# Patient Record
Sex: Male | Born: 1975 | Race: White | Hispanic: No | Marital: Married | Smoking: Never smoker
Health system: Southern US, Community
[De-identification: ages and names within clinical notes are randomized; demographics above are authoritative.]

---

## 2015-07-14 ENCOUNTER — Encounter (HOSPITAL_COMMUNITY): Payer: Self-pay | Admitting: Emergency Medicine

## 2015-07-14 ENCOUNTER — Emergency Department (HOSPITAL_COMMUNITY)
Admission: EM | Admit: 2015-07-14 | Discharge: 2015-07-14 | Disposition: A | Discharge status: Home / Self Care | Payer: Self-pay | Attending: Emergency Medicine | Admitting: Emergency Medicine

## 2015-07-14 ENCOUNTER — Emergency Department (HOSPITAL_COMMUNITY): Payer: Self-pay

## 2015-07-14 DIAGNOSIS — E1165 Type 2 diabetes mellitus with hyperglycemia: Secondary | ICD-10-CM | POA: Insufficient documentation

## 2015-07-14 DIAGNOSIS — Y9389 Activity, other specified: Secondary | ICD-10-CM | POA: Insufficient documentation

## 2015-07-14 DIAGNOSIS — R739 Hyperglycemia, unspecified: Secondary | ICD-10-CM

## 2015-07-14 DIAGNOSIS — Y92524 Gas station as the place of occurrence of the external cause: Secondary | ICD-10-CM | POA: Insufficient documentation

## 2015-07-14 DIAGNOSIS — W1839XA Other fall on same level, initial encounter: Secondary | ICD-10-CM | POA: Insufficient documentation

## 2015-07-14 DIAGNOSIS — Y998 Other external cause status: Secondary | ICD-10-CM | POA: Insufficient documentation

## 2015-07-14 DIAGNOSIS — R55 Syncope and collapse: Secondary | ICD-10-CM

## 2015-07-14 DIAGNOSIS — S79912A Unspecified injury of left hip, initial encounter: Secondary | ICD-10-CM | POA: Insufficient documentation

## 2015-07-14 LAB — BASIC METABOLIC PANEL
ANION GAP: 5 (ref 5–15)
BUN: 9 mg/dL (ref 6–20)
CO2: 23 mmol/L (ref 22–32)
Calcium: 8.6 mg/dL — ABNORMAL LOW (ref 8.9–10.3)
Chloride: 103 mmol/L (ref 101–111)
Creatinine, Ser: 0.74 mg/dL (ref 0.61–1.24)
GFR calc Af Amer: >60 mL/min (ref >60)
Glucose, Bld: 258 mg/dL — ABNORMAL HIGH (ref 65–99)
POTASSIUM: 3.6 mmol/L (ref 3.5–5.1)
SODIUM: 131 mmol/L — AB (ref 135–145)

## 2015-07-14 LAB — URINE MICROSCOPIC-ADD ON: Bacteria, UA: NONE SEEN

## 2015-07-14 LAB — URINALYSIS, ROUTINE W REFLEX MICROSCOPIC
BILIRUBIN URINE: NEGATIVE
HGB URINE DIPSTICK: NEGATIVE
KETONES UR: NEGATIVE mg/dL
Leukocytes, UA: NEGATIVE
NITRITE: NEGATIVE
PH: 7 (ref 5.0–8.0)
Protein, ur: NEGATIVE mg/dL
SPECIFIC GRAVITY, URINE: 1.016 (ref 1.005–1.030)

## 2015-07-14 LAB — CBC
HEMATOCRIT: 43.2 % (ref 39.0–52.0)
Hemoglobin: 14 g/dL (ref 13.0–17.0)
MCH: 29.9 pg (ref 26.0–34.0)
MCHC: 32.4 g/dL (ref 30.0–36.0)
MCV: 92.3 fL (ref 78.0–100.0)
Platelets: 164 10*3/uL (ref 150–400)
RBC: 4.68 MIL/uL (ref 4.22–5.81)
RDW: 13 % (ref 11.5–15.5)
WBC: 7.7 10*3/uL (ref 4.0–10.5)

## 2015-07-14 LAB — CBG MONITORING, ED: GLUCOSE-CAPILLARY: 260 mg/dL — AB (ref 65–99)

## 2015-07-14 MED ORDER — IBUPROFEN 600 MG PO TABS
600.0000 mg | ORAL_TABLET | Freq: Four times a day (QID) | ORAL | Status: AC | PRN
Start: 2015-07-14 — End: ?

## 2015-07-14 MED ORDER — SODIUM CHLORIDE 0.9 % IV BOLUS (SEPSIS)
1000.0000 mL | Freq: Once | INTRAVENOUS | Status: AC
  Administered 2015-07-14: 1000 mL via INTRAVENOUS

## 2015-07-14 MED ORDER — METHOCARBAMOL 500 MG PO TABS: 500.0000 mg | ORAL_TABLET | Freq: Two times a day (BID) | ORAL | Status: AC

## 2015-07-14 NOTE — ED Notes (Addendum)
Per EMS-patient states he fell on left hip because he got dizzy-CGB 339-states same thing happened last week-states he does not take anything for his DM-has not taken any meds in 10 years-100 mcg of Fentanyl given in route

## 2015-07-14 NOTE — ED Provider Notes (Signed)
CSN: [id number]     Arrival date & time 07/14/15  1548 History   First MD Initiated Contact with Patient 07/14/15 1808     Chief Complaint  Patient presents with  . Hyperglycemia   HPI Travis Berg is a 40 y.o. male PMH significant for diabetes (uncontrolled) presenting with 2 episodes of syncope over the last 2 weeks. His most recent syncopal episode was today. He states that he was at a gas station bathroom and after he urinated he began to feel lightheaded and he fell onto his left hip. He states episode lasted approximately 3-4 seconds. He denies fevers, chills, headache,chest pain, shortness of breath, abdominal pain, nausea, vomiting, loss of bowel or bladder control,other precipitating events.  History reviewed. No pertinent past medical history. History reviewed. No pertinent past surgical history. No family history on file. Social History  Substance Use Topics  . Smoking status: Never Smoker   . Smokeless tobacco: None  . Alcohol Use: No    Review of Systems  Ten systems are reviewed and are negative for acute change except as noted in the HPI  Allergies  Review of patient's allergies indicates no known allergies.  Home Medications   Prior to Admission medications   Medication Sig Start Date End Date Taking? Authorizing Provider  Aspirin-Salicylamide-Caffeine (BC HEADACHE POWDER PO) Take 1 packet by mouth daily as needed (headache).   Yes Historical Provider, MD   BP 113/69 mmHg  Pulse 83  Temp(Src) 98.1 F (36.7 C) (Oral)  Resp 20  SpO2 94% Physical Exam  Constitutional: He is oriented to person, place, and time. He appears well-developed and well-nourished. No distress.  HENT:  Head: Normocephalic and atraumatic.  Mouth/Throat: Oropharynx is clear and moist. No oropharyngeal exudate.  Eyes: Conjunctivae are normal. Pupils are equal, round, and reactive to light. Right eye exhibits no discharge. Left eye exhibits no discharge. No scleral icterus.  Neck: No  tracheal deviation present.  Cardiovascular: Normal rate, regular rhythm, normal heart sounds and intact distal pulses.  Exam reveals no gallop and no friction rub.   No murmur heard. Pulmonary/Chest: Effort normal and breath sounds normal. No respiratory distress. He has no wheezes. He has no rales. He exhibits no tenderness.  Abdominal: Soft. Bowel sounds are normal. He exhibits no distension and no mass. There is no tenderness. There is no rebound and no guarding.  Musculoskeletal: He exhibits tenderness. He exhibits no edema.  Decreased range of motion at left leg and left hip.  Lymphadenopathy:    He has no cervical adenopathy.  Neurological: He is alert and oriented to person, place, and time. No cranial nerve deficit. Coordination normal.  Skin: Skin is warm and dry. No rash noted. He is not diaphoretic. No erythema.  Psychiatric: He has a normal mood and affect. His behavior is normal.  Nursing note and vitals reviewed.   ED Course  Procedures  Labs Review Labs Reviewed  BASIC METABOLIC PANEL - Abnormal; Notable for the following:    Sodium 131 (*)    Glucose, Bld 258 (*)    Calcium 8.6 (*)    All other components within normal limits  CBG MONITORING, ED - Abnormal; Notable for the following:    Glucose-Capillary 260 (*)    All other components within normal limits  CBC  URINALYSIS, ROUTINE W REFLEX MICROSCOPIC (NOT AT [facility])  CBG MONITORING, ED   Imaging Review Ct Head Wo Contrast  07/14/2015  CLINICAL DATA:  Dizziness with fall, recurrent falls. EXAM: CT HEAD  WITHOUT CONTRAST TECHNIQUE: Contiguous axial images were obtained from the base of the skull through the vertex without intravenous contrast. COMPARISON:  None. FINDINGS: Brain: Ventricles are normal in size and configuration. All areas of the brain demonstrate normal gray-white matter attenuation. There is no mass, hemorrhage, edema or other evidence of acute parenchymal abnormality. No extra-axial hemorrhage.  Vascular: No hyperdense vessel or unexpected calcification. Skull: Negative for fracture or focal lesion. Sinuses/Orbits: No acute findings. Other: None. IMPRESSION: Normal noncontrast head CT. Electronically Signed   By: Bary RichardStan  Maynard M.D.   On: 07/14/2015 18:50   Dg Hip Unilat With Pelvis 2-3 Views Left  07/14/2015  CLINICAL DATA:  Status post fall on left hip, with left hip pain. Initial encounter. EXAM: DG HIP (WITH OR WITHOUT PELVIS) 2-3V LEFT COMPARISON:  None. FINDINGS: There is no evidence of fracture or dislocation. 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 Both femoral heads are seated normally within their respective acetabula. The proximal left femur appears intact. No significant degenerative change is appreciated. The sacroiliac joints are unremarkable in appearance. The visualized bowel gas pattern is grossly unremarkable in appearance. IMPRESSION: No evidence of fracture or dislocation. 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 Electronically Signed   By: Roanna RaiderJeffery  Chang M.D.   On: 07/14/2015 19:15   I have personally reviewed and evaluated these images and lab results as part of my medical decision-making.   EKG Interpretation   Date/Time:  Wednesday July 14 2015 16:10:23 EDT Ventricular Rate:  76 PR Interval:  139 QRS Duration: 95 QT Interval:  373 QTC Calculation: 419 R Axis:   86 Text Interpretation:  Sinus rhythm No old tracing to compare Confirmed by  [facility] Center [email or web address]  MD, ELLIOTT [phone]([id number]) on 07/14/2015 4:24:26 PM      MDM   Final diagnoses:  Hyperglycemia  Syncope, unspecified syncope type   Patient nontoxic-appearing, vital signs stable. Normal neuro exam. Will image the left hip and CT head. EKG unremarkable. BMP demonstrates hyperglycemia of 258. CBC unremarkable. Head CT and hip and pelvis x-ray unremarkable.  Urinalysis demonstrates >1000 glucosuria, negative ketones. Discussed case with Dr. Criss AlvineGoldston- Dizziness  most likely result of dehydration. Ordered 2 L of IV fluid. Patient may be safely discharged home. Discussed reasons for return. Patient to follow-up with primary care provider within one week. Patient in understanding and agreement with the plan.   Melton KrebsSamantha Nicole Kynzleigh Bandel, PA-C 07/14/15 2003  Pricilla LovelessScott Goldston, MD 07/18/15 [phone]

## 2015-07-14 NOTE — ED Notes (Signed)
Bed: WA02 Expected date:  Expected time:  Means of arrival:  Comments: Hold for triage 9

## 2015-07-14 NOTE — Discharge Instructions (Signed)
Travis Berg,  Nice meeting you! Please follow-up with your primary care provider. Return to the emergency department if you develop chest pain, shortness of breath, dizziness, headache, abdominal pain, nausea, vomiting, lose consciousness again. Stay hydrated with water. Feel better soon!  S. Lane HackerNicole Fifi Schindler, PA-C

## 2017-08-15 IMAGING — CT CT HEAD W/O CM
2 series · 17 of 30 positions shown, 20 images · non-contrast
Comparison: None.

CLINICAL DATA: Dizziness with fall, recurrent falls.

EXAM:
CT HEAD WITHOUT CONTRAST
TECHNIQUE: Contiguous axial images were obtained from the base of the skull
through the vertex without intravenous contrast.

[Series 2: head w/o · axial · non-contrast · 0.45mm/px · z∈[-68,+62]mm · 9 of 34 slices shown, 12 images]
[im 4/34  brain]
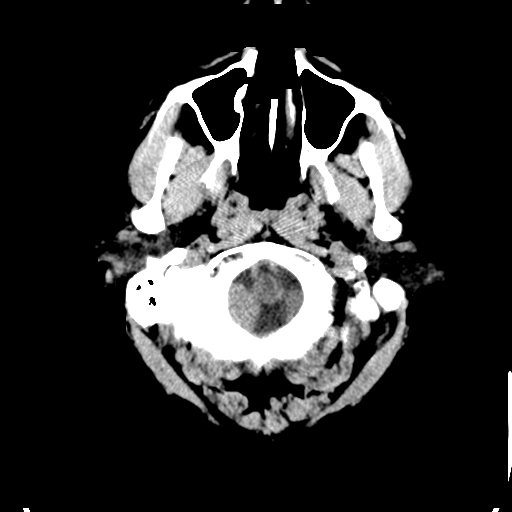
[im 4/34  bone]
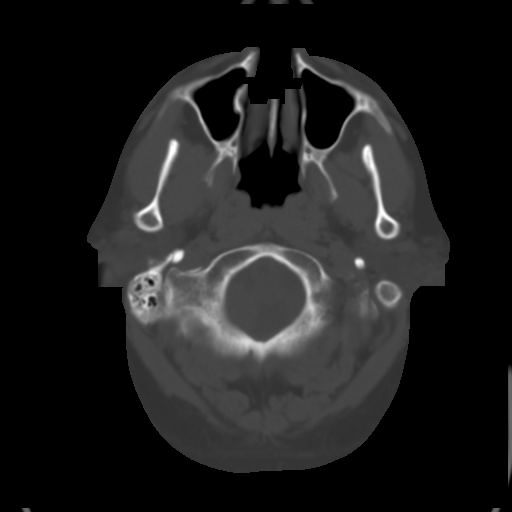
[im 7/34  brain]
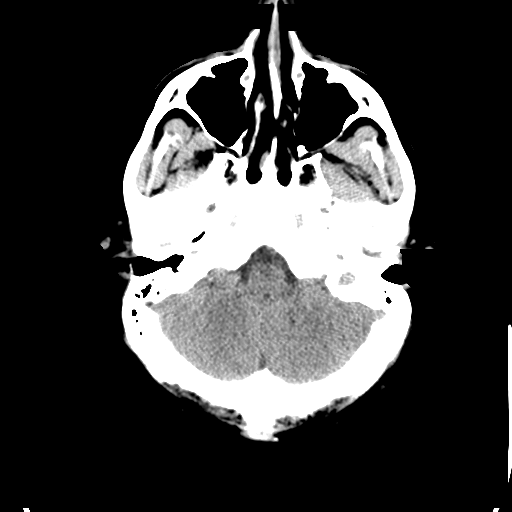
[im 10/34  brain]
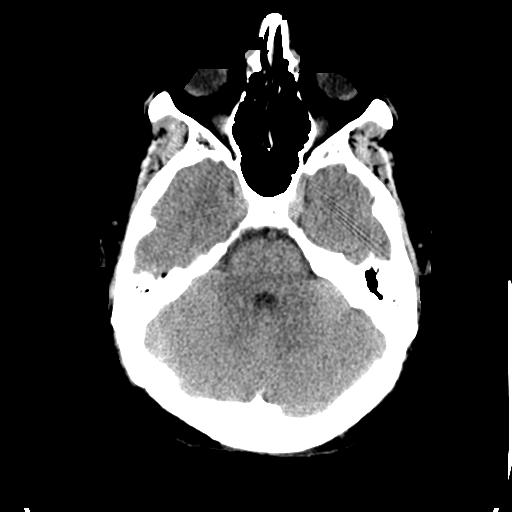
[im 14/34  brain]
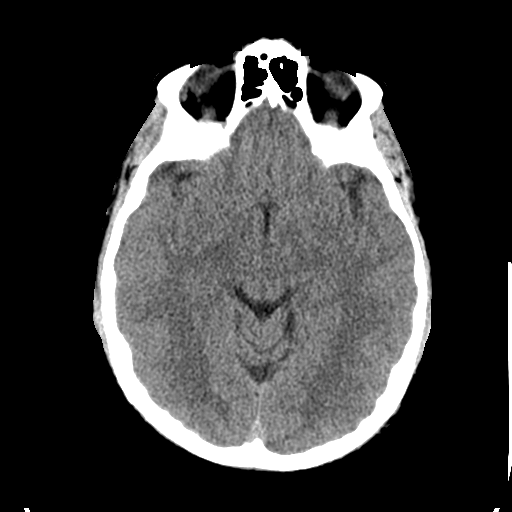
[im 17/34  brain]
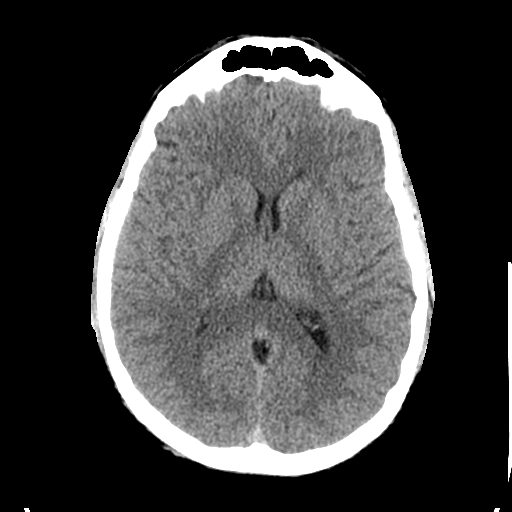
[im 17/34  bone]
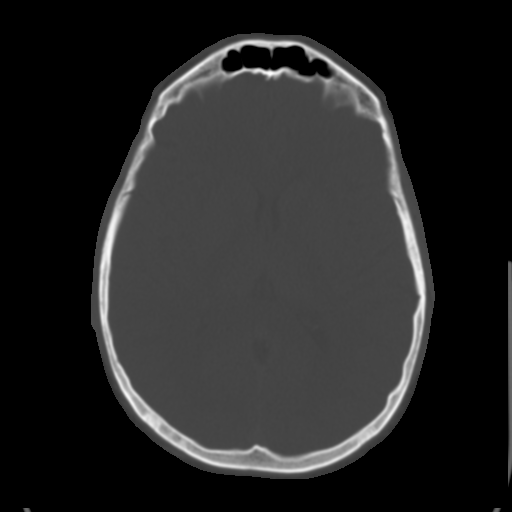
[im 20/34  brain]
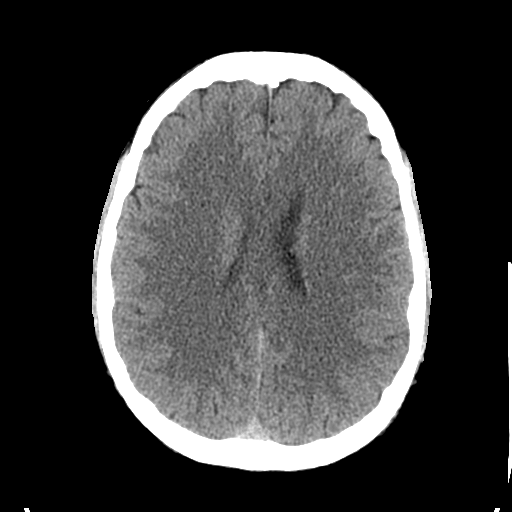
[im 24/34  brain]
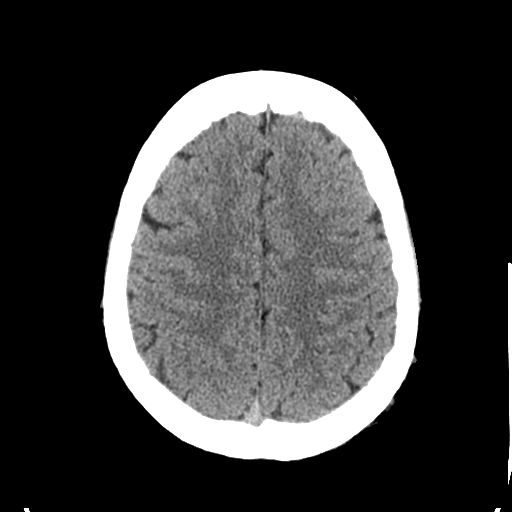
[im 27/34  brain]
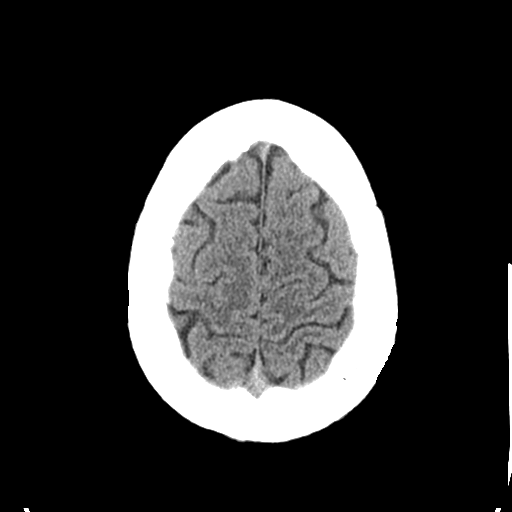
[im 30/34  brain]
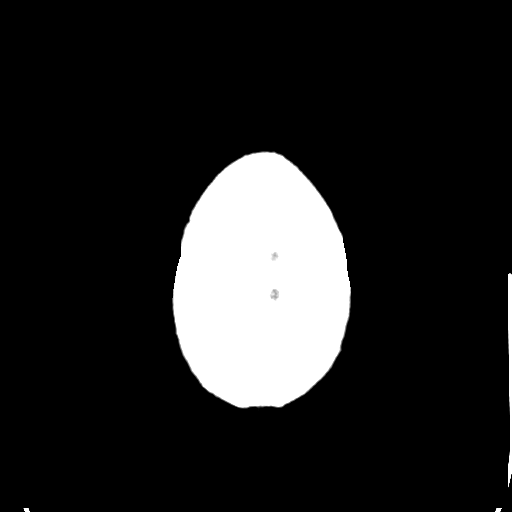
[im 30/34  bone]
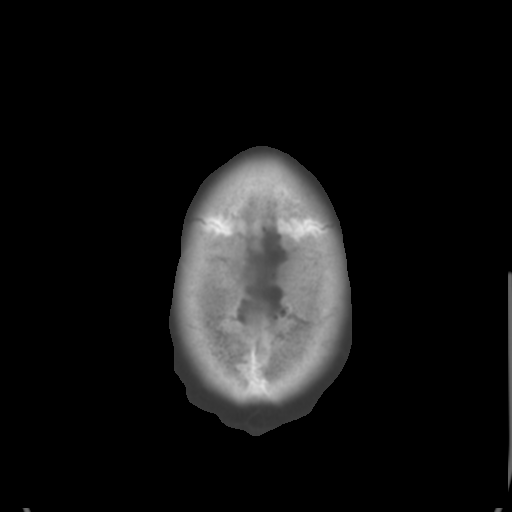

[Series 3: bone windows · axial · 0.45mm/px · z∈[-65,+61]mm · 8 of 56 slices shown]
[im 7/56  bone]
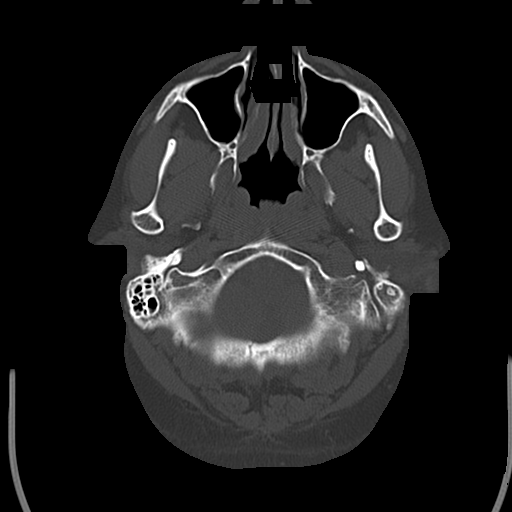
[im 13/56  bone]
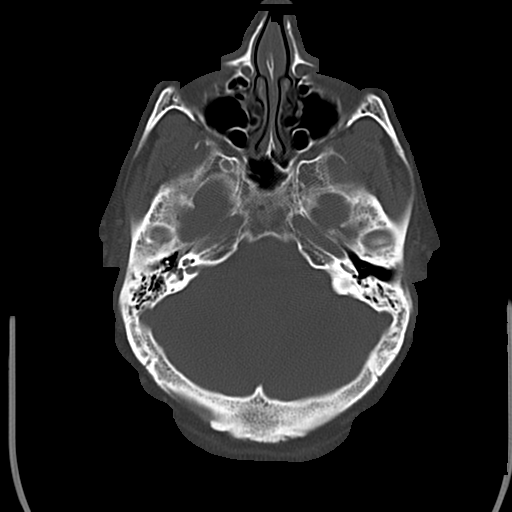
[im 19/56  bone]
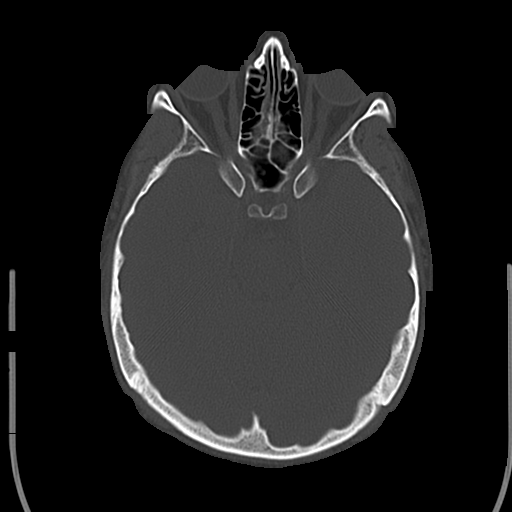
[im 25/56  bone]
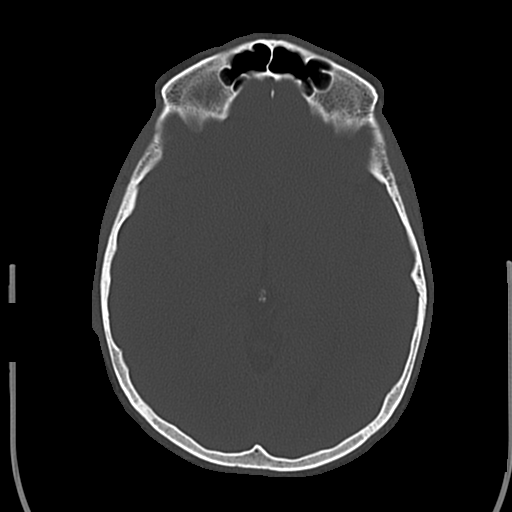
[im 31/56  bone]
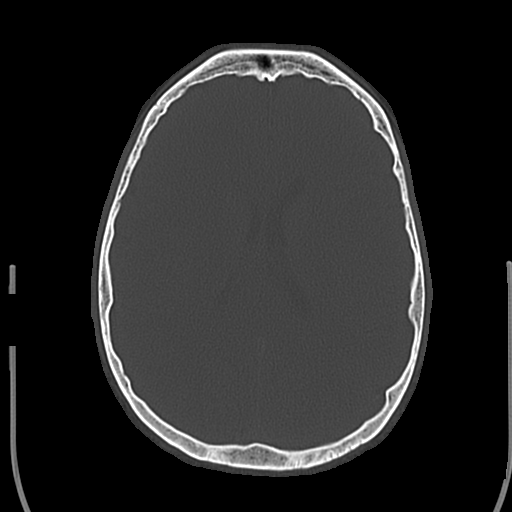
[im 37/56  bone]
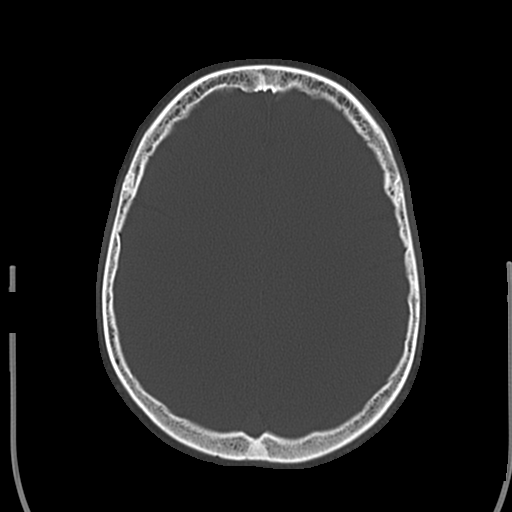
[im 43/56  bone]
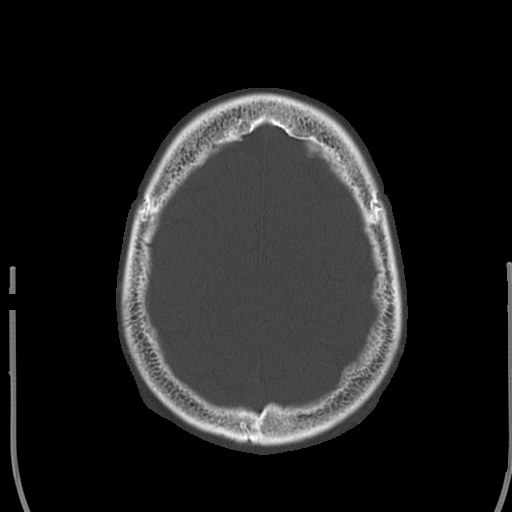
[im 49/56  bone]
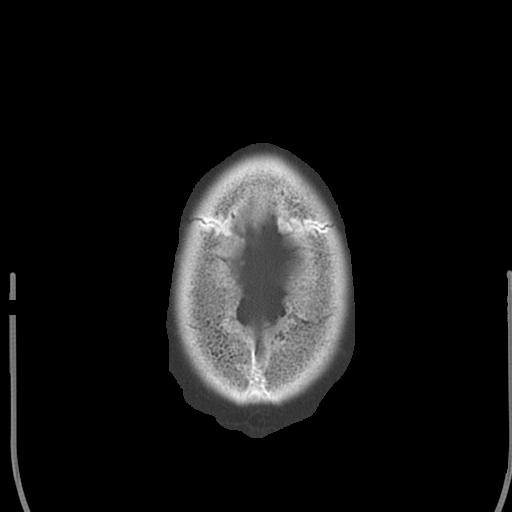

[17 of 30 positions shown; findings below may reference images not displayed]

FINDINGS: Brain: Ventricles are normal in size and configuration. All areas of
the brain demonstrate normal gray-white matter attenuation. There is
no mass, hemorrhage, edema or other evidence of acute parenchymal
abnormality. No extra-axial hemorrhage.

Vascular: No hyperdense vessel or unexpected calcification.

Skull: Negative for fracture or focal lesion.

Sinuses/Orbits: No acute findings.

Other: None.
IMPRESSION: Normal noncontrast head CT.
